# Patient Record
Sex: Female | Born: 1990 | Race: Black or African American | Hispanic: No | Marital: Single | Smoking: Never smoker
Health system: Southern US, Community
[De-identification: ages and names within clinical notes are randomized; demographics above are authoritative.]

---

## 2012-02-17 ENCOUNTER — Ambulatory Visit

## 2012-02-19 ENCOUNTER — Encounter

## 2014-09-23 ENCOUNTER — Emergency Department (HOSPITAL_COMMUNITY)
Admission: EM | Admit: 2014-09-23 | Discharge: 2014-09-23 | Disposition: A | Discharge status: Home / Self Care | Payer: Managed Care, Other (non HMO) | Attending: Emergency Medicine | Admitting: Emergency Medicine

## 2014-09-23 ENCOUNTER — Encounter (HOSPITAL_COMMUNITY): Payer: Self-pay | Admitting: *Deleted

## 2014-09-23 ENCOUNTER — Emergency Department (HOSPITAL_COMMUNITY): Payer: Managed Care, Other (non HMO)

## 2014-09-23 DIAGNOSIS — R079 Chest pain, unspecified: Secondary | ICD-10-CM | POA: Diagnosis not present

## 2014-09-23 DIAGNOSIS — R9431 Abnormal electrocardiogram [ECG] [EKG]: Secondary | ICD-10-CM | POA: Insufficient documentation

## 2014-09-23 DIAGNOSIS — R0602 Shortness of breath: Secondary | ICD-10-CM | POA: Insufficient documentation

## 2014-09-23 DIAGNOSIS — Z3202 Encounter for pregnancy test, result negative: Secondary | ICD-10-CM | POA: Insufficient documentation

## 2014-09-23 LAB — CBC
HEMATOCRIT: 42.3 % (ref 36.0–46.0)
HEMOGLOBIN: 14.6 g/dL (ref 12.0–15.0)
MCH: 31.3 pg (ref 26.0–34.0)
MCHC: 34.5 g/dL (ref 30.0–36.0)
MCV: 90.6 fL (ref 78.0–100.0)
Platelets: 281 10*3/uL (ref 150–400)
RBC: 4.67 MIL/uL (ref 3.87–5.11)
RDW: 12.1 % (ref 11.5–15.5)
WBC: 6.8 10*3/uL (ref 4.0–10.5)

## 2014-09-23 LAB — I-STAT TROPONIN, ED: Troponin i, poc: 0 ng/mL (ref 0.00–0.08)

## 2014-09-23 LAB — BASIC METABOLIC PANEL
Anion gap: 9 (ref 5–15)
BUN: 9 mg/dL (ref 6–23)
CO2: 24 mmol/L (ref 19–32)
Calcium: 10 mg/dL (ref 8.4–10.5)
Chloride: 106 mmol/L (ref 96–112)
Creatinine, Ser: 0.88 mg/dL (ref 0.50–1.10)
GFR calc Af Amer: >90 mL/min (ref >90)
GFR calc non Af Amer: >90 mL/min (ref >90)
Glucose, Bld: 90 mg/dL (ref 70–99)
Potassium: 3.7 mmol/L (ref 3.5–5.1)
Sodium: 139 mmol/L (ref 135–145)

## 2014-09-23 LAB — POC URINE PREG, ED: PREG TEST UR: NEGATIVE

## 2014-09-23 NOTE — ED Notes (Signed)
The pt ius c/o intermittent chest pain since this am.  And the pain takes her breath away.  No recent could or injury.Marland Kitchen.  lmp none  implinon

## 2014-09-23 NOTE — Discharge Instructions (Signed)
Call for a follow up appointment with a Family or Primary Care Provider.  Call a cardiologist for further evaluation of your abnormal EKG. Return if Symptoms worsen.   Take medication as prescribed.  Take ibuprofen or Tylenol for pain.   Emergency Department Resource Guide 1) Find a Doctor and Pay Out of Pocket Although you won't have to find out who is covered by your insurance plan, it is a good idea to ask around and get recommendations. You will then need to call the office and see if the doctor you have chosen will accept you as a new patient and what types of options they offer for patients who are self-pay. Some doctors offer discounts or will set up payment plans for their patients who do not have insurance, but you will need to ask so you aren't surprised when you get to your appointment.  2) Contact Your Local Health Department Not all health departments have doctors that can see patients for sick visits, but many do, so it is worth a call to see if yours does. If you don't know where your local health department is, you can check in your phone book. The CDC also has a tool to help you locate your state's health department, and many state websites also have listings of all of their local health departments.  3) Find a Walk-in Clinic If your illness is not likely to be very severe or complicated, you may want to try a walk in clinic. These are popping up all over the country in pharmacies, drugstores, and shopping centers. They're usually staffed by nurse practitioners or physician assistants that have been trained to treat common illnesses and complaints. They're usually fairly quick and inexpensive. However, if you have serious medical issues or chronic medical problems, these are probably not your best option.  No Primary Care Doctor: - Call Health Connect at  [phone] - they can help you locate a primary care doctor that  accepts your insurance, provides certain services,  etc. - Physician Referral Service- [phone]  Chronic Pain Problems: Organization         Address  Phone   Notes  [facility]  [phone] Patients need to be referred by their primary care doctor.   Medication Assistance: Organization         Address  Phone   Notes  [facility] Medication [facility] [address] [place], [place] [address] [phone] --Must be a resident of [place] -- Must have NO insurance coverage whatsoever (no Medicaid/ Medicare, etc.) -- The pt. MUST have a primary care doctor that directs their care regularly and follows them in the community   MedAssist  [phone]   [employer]  [phone]    Agencies that provide inexpensive medical care: Organization         Address  Phone   Notes  [facility]  [phone]   [facility]    [phone]   [facility] [address] [place], [place] [address] [phone]   [facility] [address], [place] [phone]   Planned Parenthood    [phone]   [facility]    [phone]) [phone]   [facility]  [address], [place] Phone:  [phone]) [phone], Fax:  [phone] Hours of Operation:  9 am - 6 pm, M-F.  Also accepts Medicaid/Medicare  and self-pay.  [facility] for [place], [address], [place] Phone: [phone], Fax: [phone]. Hours of Operation:  8:30 am - 5:30 pm, M-F.  Also accepts Medicaid and self-pay.  [facility] [place] [address], [place] Phone: [phone]   [place], [place], [place] [phone], Ext. [phone] Mondays & Thursdays: 7-9 AM.  First 15 patients are seen on a first come, first serve basis.    [place] Providers:  Organization         Address  Phone   Notes  [facility] [address], [place] [phone] Also accepts self-pay patients.  [facility] [address], [place]  [phone]   [place], [address], [place] [phone]   [facility] [address] [place] [phone]   Lucianne Lei [address], [place]   [phone] Only accepts [place] Access [place] patients after they have their name applied to their card.   Self-Pay (no insurance) in [facility]:  Organization         Address  Phone   Notes  Sickle Cell Patients, [facility] [place] [phone]   [facility] [place] [phone]   [facility] [place]  [place], [place], [place] [phone]   [facility]/Dr. Osei-Bonsu  [address] [place] or [address], [place] [phone] Phone number for both [place] and [place] locations is the same.  [facility] [address] [place] [phone]   [facility] [address] [place] or [address] [phone]   [facility] [address] [place] [phone], phone; [phone], fax Sees patients 1st and 3rd Saturday of every month.  Must not qualify for public or private insurance (i.e. Medicaid, Medicare, [place] Health Choice, Veterans' Benefits)  Household income should be no more than 200% of the poverty level The clinic cannot treat you if you are pregnant or think you are pregnant  Sexually transmitted diseases are not treated at the clinic.    Dental Care: Organization         Address  Phone  Notes  [facility] Department of [facility] [phone] Accepts children up to  age 26 who are enrolled in [place] or [place]; pregnant women with a Medicaid card; and children who have applied for Medicaid or [place] Health Choice, but were declined, whose parents can pay a reduced fee at time of service.  [facility] Department of [facility]  [address], [place] [phone] Accepts children up to age 43 who are enrolled in [place] or [place]; pregnant women with a Medicaid card; and children who have applied for Medicaid or [place] Health Choice, but were declined, whose parents can pay a reduced fee at time of service.  [facility] Access PRO[facility]  [place] [phone] Patients are seen by appointment only. Walk-ins are not accepted. [place] will see patients 55 years of age and older. Monday - Tuesday (8am-5pm) Most Wednesdays (  8:30-5pm) $30 per visit, cash only  [facility] Access PROGRAM  [address], [facility] [phone] Patients are seen by appointment only. Walk-ins are not accepted. [place] will see patients 72 years of age and older. One Wednesday Evening (Monthly: Volunteer Based).  $30 per visit, cash only  [place]  [phone] for adults; Children under age 45, call Graduate Pediatric Dentistry at [phone]. Children aged 7-14, please call [phone] to request a pediatric application.  Dental services are provided in all areas of dental care including fillings, crowns and bridges, complete and partial dentures, implants, gum treatment, root canals, and extractions. Preventive care is also provided. Treatment is provided to both adults and children. Patients are selected via a lottery and there is often a waiting list.   [facility] [address], [place]  [phone] [email or web address]   [facility] [address] [place], [place] [phone], Ext. [phone] Second and Fourth Thursday of  each month, opens at 6:30 AM; Clinic ends at 9 AM.  Patients are seen on a first-come first-served basis, and a limited number are seen during each clinic.   [facility]  [address] Hillard Danker [place], [place] [phone]   Eligibility Requirements You must have lived in [place], [place], or [place] counties for at least the last three months.   You cannot be eligible for state or federal sponsored [employer], including [employer], [place], or [employer].   You generally cannot be eligible for healthcare insurance through your employer.    How to apply: Eligibility screenings are held every Tuesday and Wednesday afternoon from 1:00 pm until 4:00 pm. You do not need an appointment for the interview!  [facility] [address], [place], [place]   [place]  [place] Department  [place]  [phone]    Behavioral Health Resources in the Community: Intensive Outpatient Programs Organization         Address  Phone  Notes  [place] [address], [place], [place] [phone]   [facility] Outpatient [address] [place], [place]   ADS: Alcohol & Drug Svcs [address] [place], [place]   [place] [address],  [place], [place] or [phone]   Substance Abuse Resources Organization         Address  Phone  Notes  Alcohol and Drug Services  [phone]   [place]  [phone]   [facility] [place]   Chinita Pester  [phone]   Residential & Outpatient Substance Abuse Program  [phone]   Psychological Services Organization         Address  Phone  Notes  [facility] [place]  [place]  [phone]   [place] [address],  [place] or [phone]    Mobile Crisis Teams Organization         Address  Phone  Notes  [facility], Mobile Crisis Care Unit  [phone]   [facility]ive Psychotherapeutic Services  [address] [place], [place]   [place] Levels [address] [place] [phone]    Self-Help/Support Groups Organization         Address  Phone  Notes  [facility]ental Health Assoc[facility] of [place] - variety of support groups  [place] Call for more information  Narcotics Anonymous (NA), [facility] [address], [employer] [place]  2 meetings at this location   Special educational needs teacher         Address  Phone  Notes  ASAP Residential Treatment [place],    [place]  [phone]   [facility]  [address], [place] [address], [place], [place]   [place], [place] [phone] Admissions: 8am-3pm M-F  Incentives Substance [place] [address]    [place], [place] [phone]   [facility] [address] [place], [place], [place]   [facility] [address],  [place], [place]   [facility] Programs - Intensive Outpatient [address], [place], [place]   [facility] ([employer] [place].,  [place], [place] [phone] or [phone]   [facility] Services (RTS) [address], [place], [place] Accepts Medicaid  [facility] [place] [address],  [place] [phone] Substance Abuse/Addiction Treatment   [facility] Organization         Address  Phone  Notes  [facility]  [phone]   Domenic Schwab, PhD [address] Arlis Porta [place], [place]   [phone] or [phone]   [place], [place] [phone]     [facility] [address], [place], [place] [phone] Insurance/Medicaid/sponsorship through [facility] and Families [address], [address] [place]                                    [place], [place] [phone] [place] [address], [place] [phone]    Dr. Adele Schilder  [phone]   [facility] [place] Dept. 1) [address] [place] 2) [place] 3)  [place] [address], [place] [phone]  [phone]   [place] [phone] or [phone] (After Hours)

## 2014-09-23 NOTE — ED Notes (Signed)
Pt ambulated without distress, 02 sats 98-100%, HR 99-104.

## 2014-09-23 NOTE — ED Provider Notes (Signed)
CSN: [id number]     Arrival date & time 09/23/14  1710 History   First MD Initiated Contact with Patient 09/23/14 1937     Chief Complaint  Patient presents with  . Chest Pain     (Consider location/radiation/quality/duration/timing/severity/associated sxs/prior Treatment) HPI Comments: The patient is a 24 year-old presenting to the emergency room chief complaint of chest discomfort since this morning. Patient reports central chest discomfort as sharp with radiation into the upper chest and neck region. She reports sharp pains last approximately 3-5 seconds, self resolved. Initial onset this morning while laying in bed. Patient reports associated dyspnea during discomfort. Patient denies chest pain or shortness breath at this time. Patient denies cough, fever, lower extremity edema, palpitations. No recent travel, family history or personal history of DVT/PE, lower extremity swelling, smoking, cancer, or exogenous estrogen. Patient denies recent illness, IV drug use, cocaine use, early heart attack in family, hypertension, hyperlipidemia, tobacco use. NO PCP  The history is provided by the patient. No language interpreter was used.    History reviewed. No pertinent past medical history. History reviewed. No pertinent past surgical history. No family history on file. History  Substance Use Topics  . Smoking status: Never Smoker   . Smokeless tobacco: Not on file  . Alcohol Use: Yes   OB History    No data available     Review of Systems  Constitutional: Negative for fever and chills.  Respiratory: Positive for shortness of breath. Negative for cough and wheezing.   Cardiovascular: Positive for chest pain. Negative for leg swelling.  Gastrointestinal: Negative for nausea, vomiting, abdominal pain and diarrhea.  Neurological: Negative for light-headedness.      Allergies  Review of patient's allergies indicates no known allergies.  Home Medications   Prior to Admission  medications   Medication Sig Start Date End Date Taking? Authorizing Provider  etonogestrel (NEXPLANON) 68 MG IMPL implant 1 each by Subdermal route once. June 2013   Yes Historical Provider, MD   BP 129/94 mmHg  Pulse 80  Temp(Src) 98.6 F (37 C) (Oral)  Resp 20  Ht  (1.549 m)  Wt 112 lb (50.803 kg)  BMI 21.17 kg/m2  SpO2 100% Physical Exam  Constitutional: She is oriented to person, place, and time. She appears well-developed and well-nourished. No distress.  HENT:  Head: Normocephalic and atraumatic.  Eyes: EOM are normal. No scleral icterus.  Neck: Neck supple.  Cardiovascular: Normal rate and regular rhythm.  Exam reveals no friction rub.   No murmur heard. No lower extremity edema, calf tenderness, negative Homans sign.  Pulmonary/Chest: Effort normal. No respiratory distress. She has no wheezes. She has no rales. She exhibits no tenderness.  Patient is able to speak in complete sentences.   Abdominal: Soft. There is no tenderness. There is no rebound and no guarding.  Neurological: She is alert and oriented to person, place, and time.  Skin: Skin is warm and dry. She is not diaphoretic.  Psychiatric: She has a normal mood and affect. Her behavior is normal.  Nursing note and vitals reviewed.   ED Course  Procedures (including critical care time) Labs Review Labs Reviewed  CBC  BASIC METABOLIC PANEL  I-STAT TROPOININ, ED  POC URINE PREG, ED    Imaging Review Dg Chest 2 View  09/23/2014   CLINICAL DATA:  Chest pain and shortness of breath. Acute symptoms, initial evaluation.  EXAM: CHEST  2 VIEW  COMPARISON:  None.  FINDINGS: The heart size and mediastinal  contours are within normal limits. Both lungs are clear. The visualized skeletal structures are unremarkable.  IMPRESSION: Normal chest.   Electronically Signed   By: Awilda Metroourtnay  Bloomer   On: 09/23/2014 21:02     EKG Interpretation   Date/Time:  Wednesday September 23 2014 17:17:39 EST Ventricular Rate:   85 PR Interval:  110 QRS Duration: 70 QT Interval:  348 QTC Calculation: 414 R Axis:   75 Text Interpretation:  Sinus rhythm with sinus arrhythmia with short PR  Right atrial enlargement RSR' or QR pattern in V1 suggests right  ventricular conduction delay Septal infarct , age undetermined Abnormal  ECG Confirmed by Freida BusmanALLEN  MD, ANTHONY ([id number]) on 09/23/2014 8:07:42 PM      MDM   Final diagnoses:  Chest pain, unspecified chest pain type  Abnormal EKG   Patient presents with chest pain, lasting 3-5 seconds, no aggravating factors, not worsened with exertion. Patient reports dyspnea however she is speaking in complete sentences SPO2 100% on room air. Patient PERC negative. Patient able to ambulate with pulse ox of 100%. Discussed with Dr. Freida BusmanAllen, given history and no exam findings, no clinical signs of DVT, PE no hypoxia, plan to discharge home. Will discharge home with cardiology referral given abnormal EKG. Reevaluation patient resting complain room denies further chest discomfort or shortness of breath episodes. Discussed lab results, imaging results, and treatment plan with the patient. Return precautions given. Reports understanding and no other concerns at this time.  Patient is stable for discharge at this time.   Mellody DrownLauren Shelsie Tijerino, PA-C 09/23/14 2239  Toy BakerAnthony T Allen, MD 09/23/14 [id number]

## 2015-07-17 IMAGING — DX DG CHEST 2V
2 series · 2 of 2 positions shown · non-contrast
Comparison: None.

CLINICAL DATA: Chest pain and shortness of breath. Acute symptoms,
initial evaluation.

EXAM:
CHEST  2 VIEW

[chest pa]
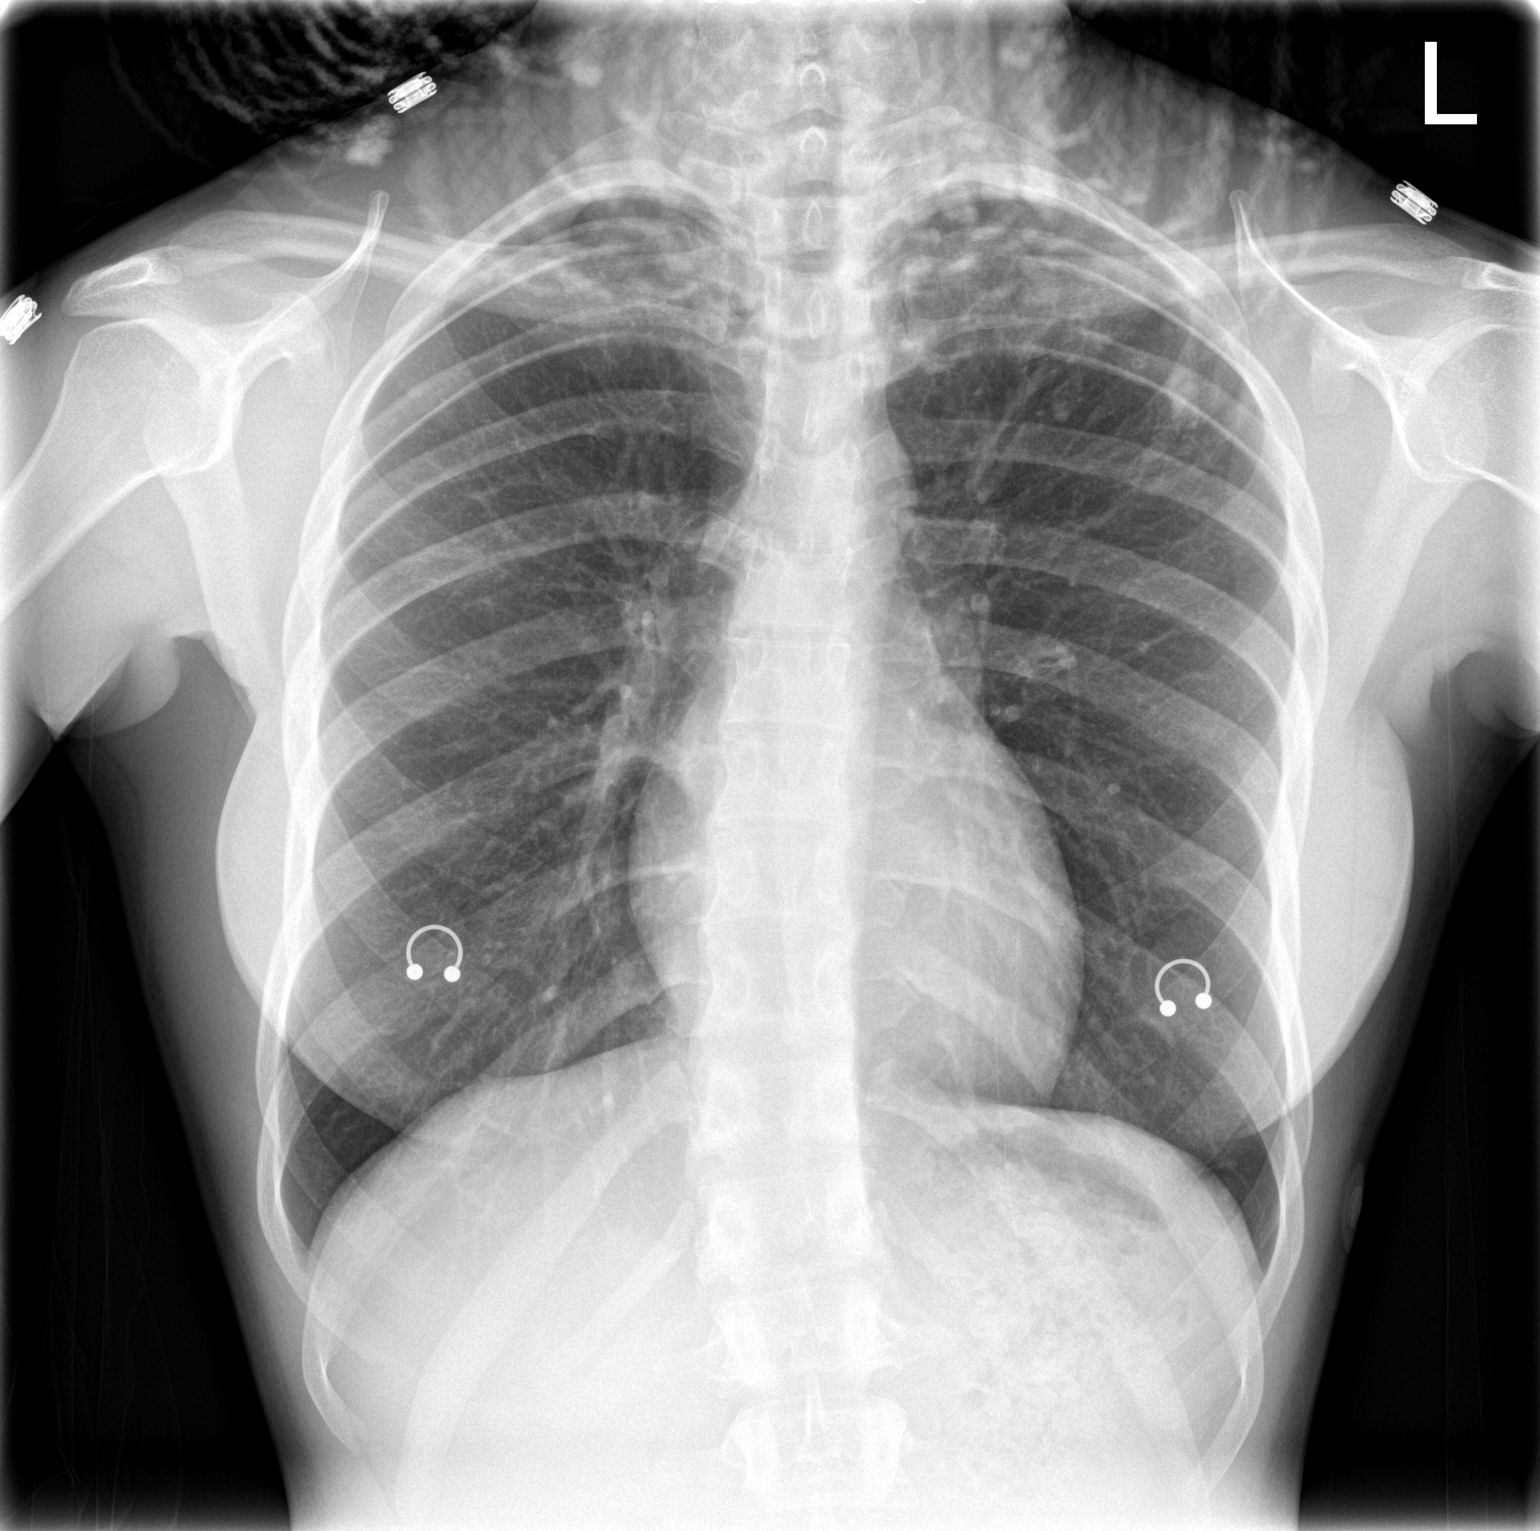

[chest lat]
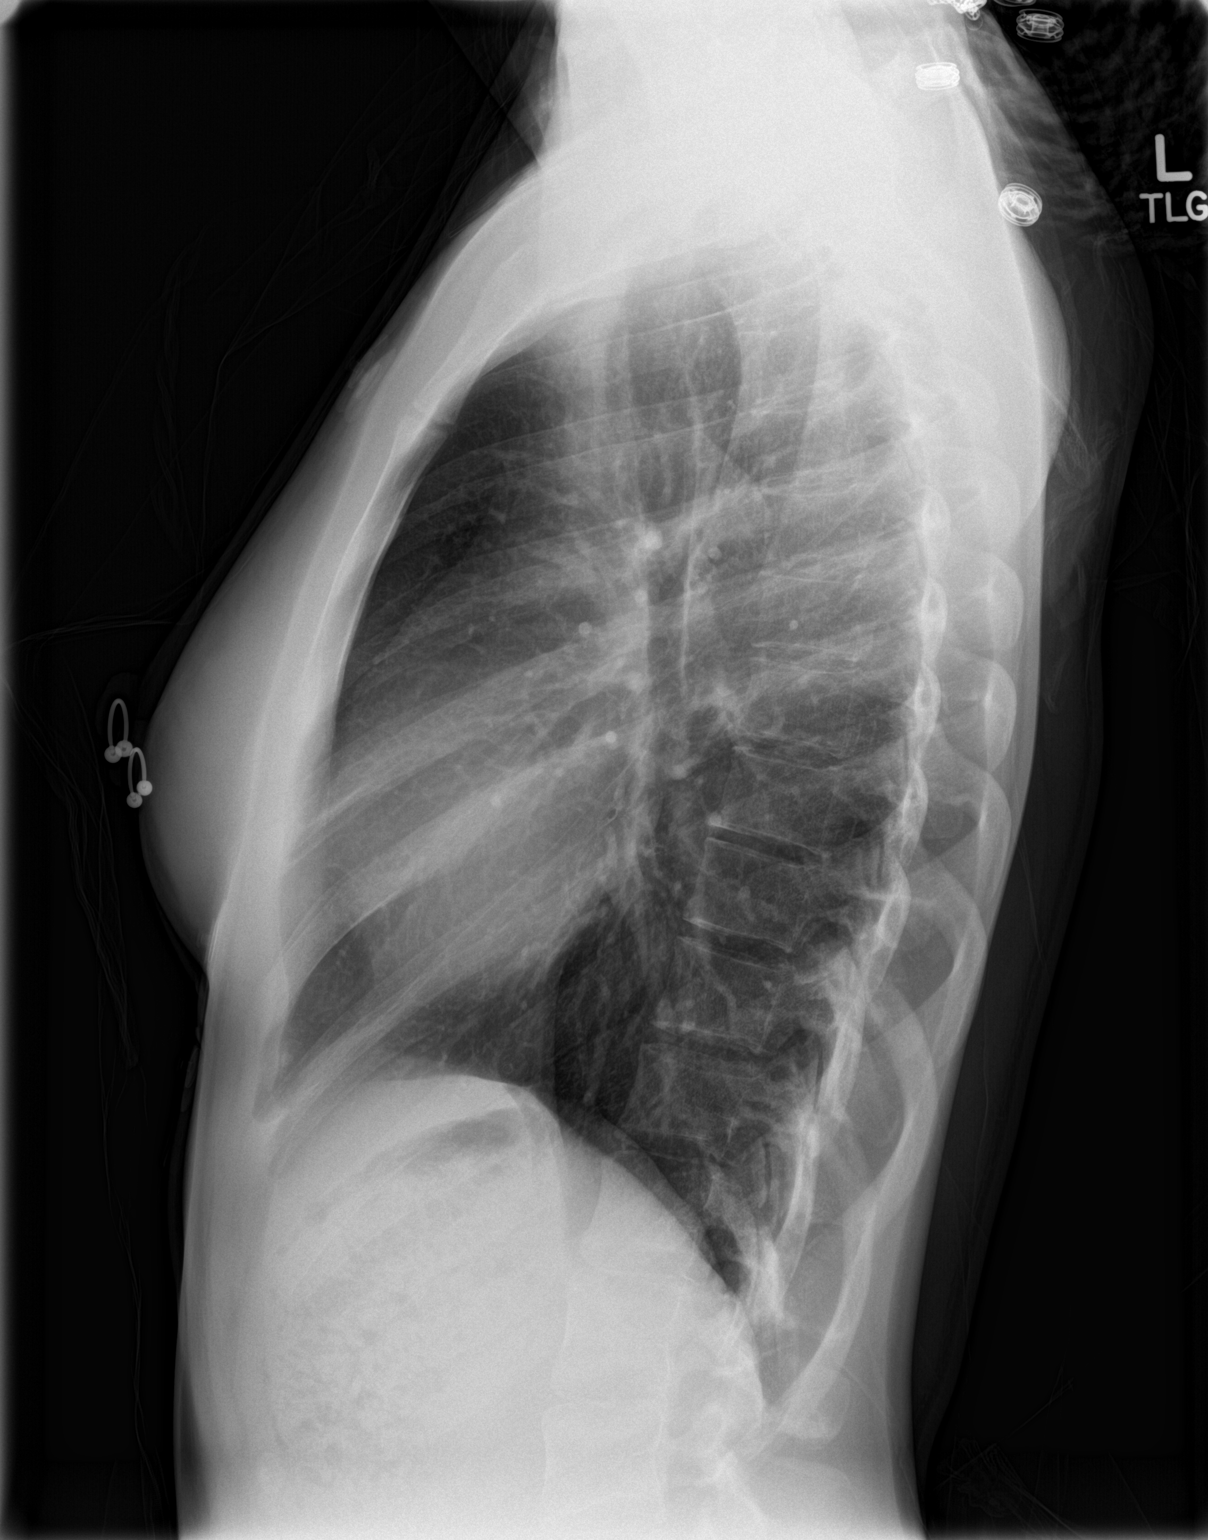

[2 of 2 positions shown; findings below may reference images not displayed]

FINDINGS: The heart size and mediastinal contours are within normal limits.
Both lungs are clear. The visualized skeletal structures are
unremarkable.
IMPRESSION: Normal chest.

  By: Kaudinge Sabatha
